# Patient Record
Sex: Female | Born: 1985 | Race: Black or African American | Hispanic: No | Marital: Single | Smoking: Current some day smoker
Health system: Southern US, Community
[De-identification: ages and names within clinical notes are randomized; demographics above are authoritative.]

## PROBLEM LIST (undated history)

## (undated) HISTORY — PX: KNEE SURGERY: SHX244

---

## 2017-08-25 ENCOUNTER — Ambulatory Visit: Payer: Self-pay | Admitting: Obstetrics & Gynecology

## 2017-08-25 ENCOUNTER — Encounter: Payer: Self-pay | Admitting: Obstetrics & Gynecology

## 2017-08-25 VITALS — BP 136/84 | HR 88 | Resp 16 | Ht 68.5 in | Wt 335.0 lb

## 2017-08-25 DIAGNOSIS — N938 Other specified abnormal uterine and vaginal bleeding: Secondary | ICD-10-CM

## 2017-08-25 MED ORDER — IBUPROFEN 800 MG PO TABS: 800.0000 mg | ORAL_TABLET | Freq: Three times a day (TID) | ORAL | 1 refills | Status: AC | PRN

## 2017-08-25 MED ORDER — MEDROXYPROGESTERONE ACETATE 10 MG PO TABS: ORAL_TABLET | ORAL | 12 refills | Status: AC

## 2017-08-25 NOTE — Progress Notes (Signed)
Patient ID: Kristin Larson, female   DOB: 1985/09/05, 32 y.o.   MRN: [id number]  Chief Complaint  Patient presents with  . Menstrual Problem    HPI Kristin Larson is a 32 y.o. female. Single P0 here today with the issue of irregular periods. Menarche at 70. Her periods were regular but super heavy until about 32 years old. She started OCPs and the periods then got regular. She stopped OCPs at about 32 yo. She has tried cyclic progestin. She will skip periods, almost an entire year. In the year 2018 she had about 3 periods.  She is actively trying to get pregnant. She has not used birth control since she was 21. She does not use condoms.   HPI  No past medical history on file.  Past Surgical History:  Procedure Laterality Date  . KNEE SURGERY Right     Family History  Problem Relation Age of Onset  . Breast cancer Maternal Grandmother     Social History Social History   Tobacco Use  . Smoking status: Current Some Day Smoker    Packs/day: 1.00    Years: 13.00    Pack years: 13.00    Types: Cigarettes  . Smokeless tobacco: Never Used  Substance Use Topics  . Alcohol use: Not Currently  . Drug use: Yes    Types: Marijuana    Allergies not on file  Current Outpatient Medications  Medication Sig Dispense Refill  . cetirizine (ZYRTEC) 10 MG tablet Take 10 mg by mouth daily.    Marland Kitchen ibuprofen (ADVIL,MOTRIN) 200 MG tablet Take by mouth.     No current facility-administered medications for this visit.     Review of Systems Review of Systems She reports a pap smear at [facility] in [place] in 2018. She reports that is was normal.  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 rate 16, height 5' 8.5" (1.74 m), weight (!) 335 lb (152 kg), last menstrual period 07/25/2017.  Physical Exam Physical Exam Breathing, conversing, and ambulating normally Morbidly obese  pleasant Black female  Data Reviewed No data available She reports TSH checked and normal at [facility] center Assessment    Oligomenorrhea, DUB, probable PCOS     Plan    Rec cyclic provera 10 day/month with 10 mg Rec weight loss Offered referral to RE I sent message to St[facility] Bang to get her into [facility] Gyn u/s       Kristin Larson 08/25/2017, 2:54 PM

## 2017-08-25 NOTE — Addendum Note (Signed)
Addended by: Allie Bossier on: 08/25/2017 03:21 PM   Modules accepted: Orders

## 2017-08-26 LAB — CBC
HEMATOCRIT: 36 % (ref 35.0–45.0)
HEMOGLOBIN: 12 g/dL (ref 11.7–15.5)
MCH: 30.6 pg (ref 27.0–33.0)
MCHC: 33.3 g/dL (ref 32.0–36.0)
MCV: 91.8 fL (ref 80.0–100.0)
MPV: 10.3 fL (ref 7.5–12.5)
Platelets: 347 10*3/uL (ref 140–400)
RBC: 3.92 10*6/uL (ref 3.80–5.10)
RDW: 13.4 % (ref 11.0–15.0)
WBC: 8.3 10*3/uL (ref 3.8–10.8)

## 2017-08-30 ENCOUNTER — Ambulatory Visit (INDEPENDENT_AMBULATORY_CARE_PROVIDER_SITE_OTHER): Payer: Self-pay

## 2017-08-30 DIAGNOSIS — N938 Other specified abnormal uterine and vaginal bleeding: Secondary | ICD-10-CM

## 2017-09-20 ENCOUNTER — Ambulatory Visit (INDEPENDENT_AMBULATORY_CARE_PROVIDER_SITE_OTHER): Payer: Self-pay | Admitting: Obstetrics & Gynecology

## 2017-09-20 ENCOUNTER — Encounter: Payer: Self-pay | Admitting: Obstetrics & Gynecology

## 2017-09-20 VITALS — BP 134/78 | HR 108 | Resp 16 | Ht 68.5 in | Wt 335.0 lb

## 2017-09-20 DIAGNOSIS — N915 Oligomenorrhea, unspecified: Secondary | ICD-10-CM

## 2017-09-20 NOTE — Progress Notes (Signed)
   Subjective:    Patient ID: Kristin Larson, female    DOB: 04/27/1985, 32 y.o.   MRN: [id number]  HPI 32 yo single G0 here for u/s results. She has oligomenorrhea. She has regular periods when on OCPs but rarely has periods without OCPs or cyclic progestin. Her u/s was normal (no PCOS appearance of ovaries) and a 3 mm   Review of Systems She has been contacted by St[facility] BangSabrina Holland for a [facility] pap smear.    Objective:   Physical Exam  Breathing, conversing, and ambulating normally Well nourished, well hydrated Black female, no apparent distress       Assessment & Plan:  Irregular presentation of PCOS- check LH/FSH/Prolactin I will message her through Mychart about these results

## 2017-09-21 LAB — FOLLICLE STIMULATING HORMONE: FSH: 4.4 m[IU]/mL

## 2017-09-21 LAB — LUTEINIZING HORMONE: LH: 4.8 m[IU]/mL

## 2017-09-21 LAB — PROLACTIN: Prolactin: 9.8 ng/mL

## 2019-08-10 IMAGING — US US TRANSVAGINAL NON-OB
1 series · 14 of 25 positions shown · non-contrast
Comparison: None.

CLINICAL DATA: Dysfunctional uterine bleeding

EXAM:
ULTRASOUND PELVIS TRANSVAGINAL
TECHNIQUE: Transvaginal ultrasound examination of the pelvis was performed
including evaluation of the uterus, ovaries, adnexal regions, and
pelvic cul-de-sac.

[Series 1: us transvaginal non-ob · 0.11mm/px · 14 of 61 slices shown]
[im 1/61]
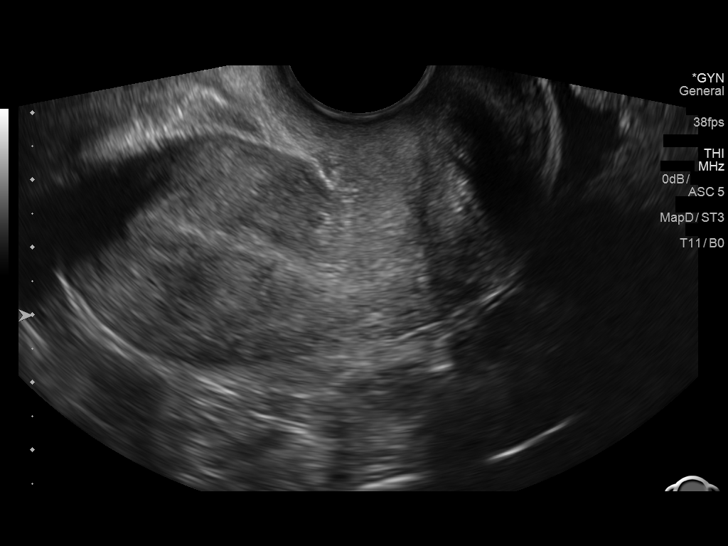
[im 6/61]
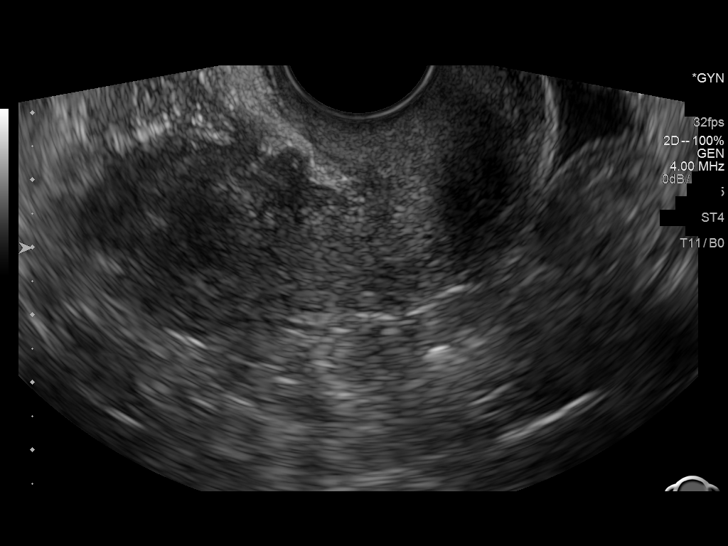
[im 11/61]
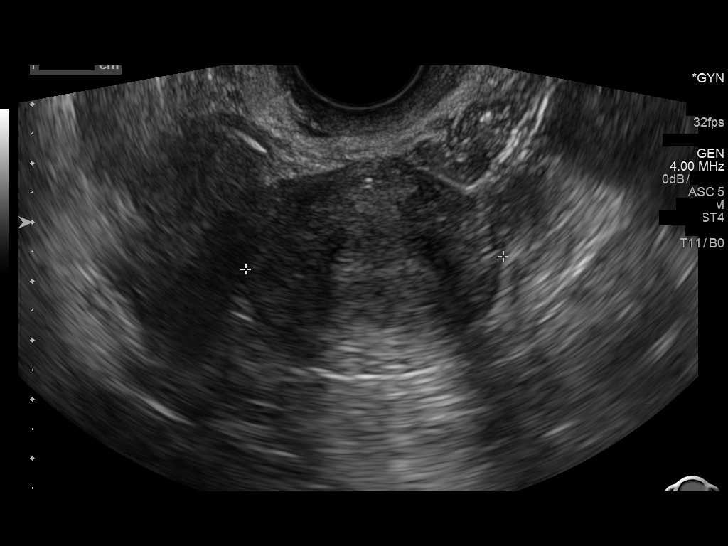
[im 16/61]
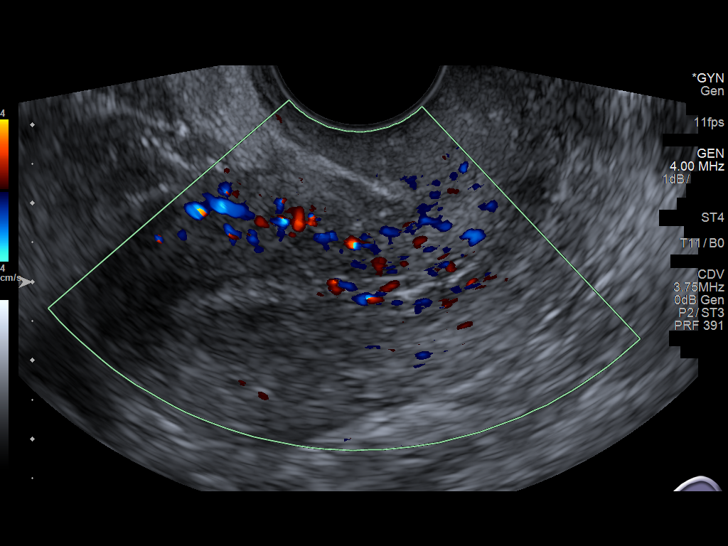
[im 21/61]
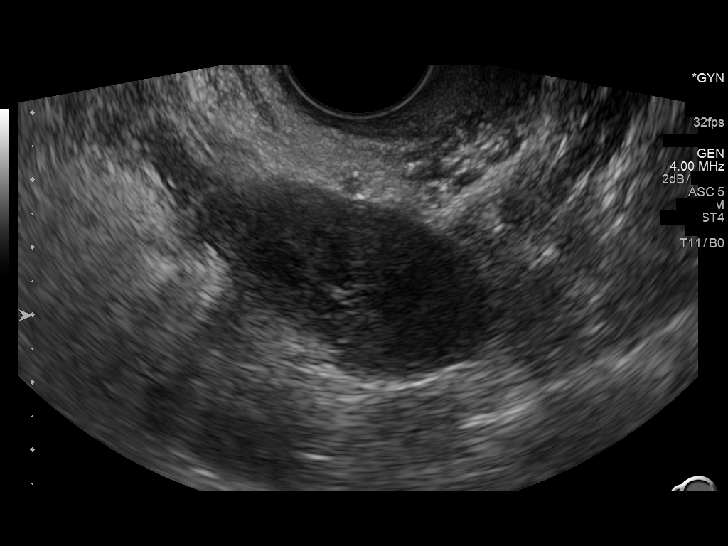
[im 23/61]
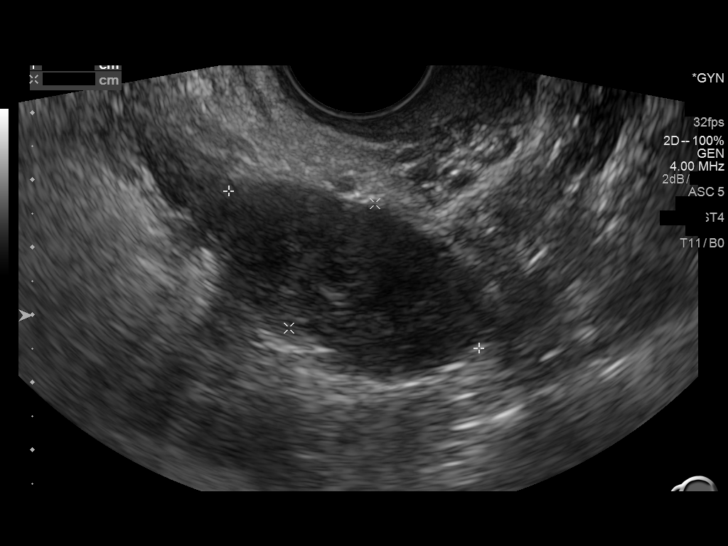
[im 28/61]
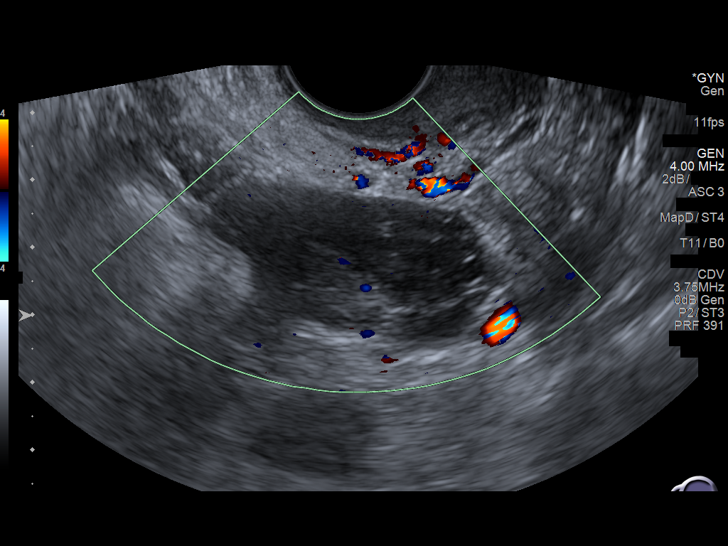
[im 33/61]
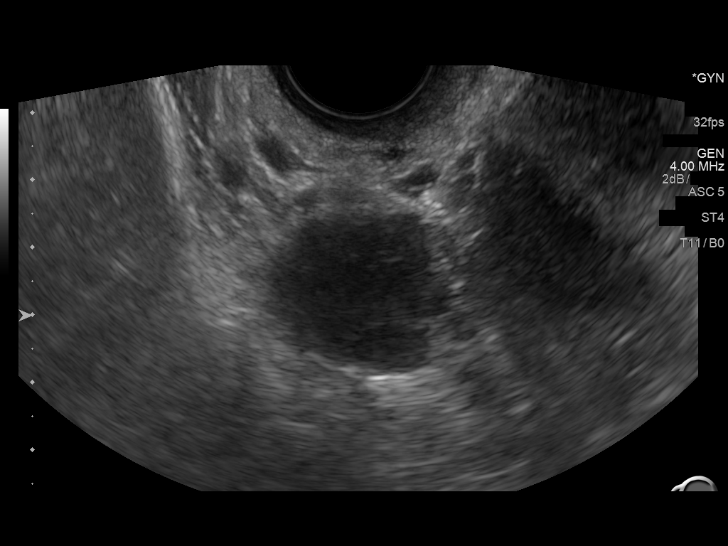
[im 38/61]
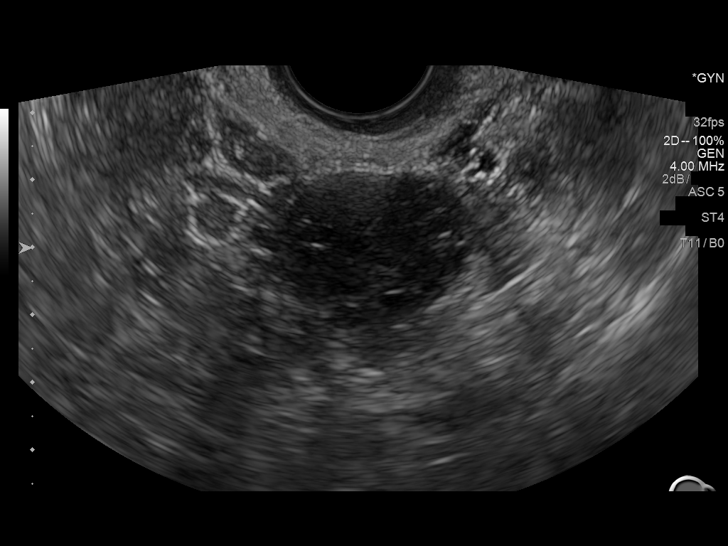
[im 41/61]
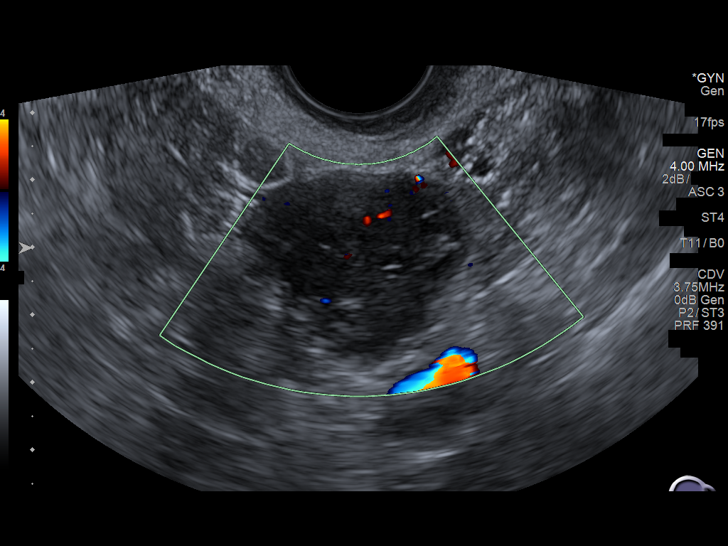
[im 46/61]
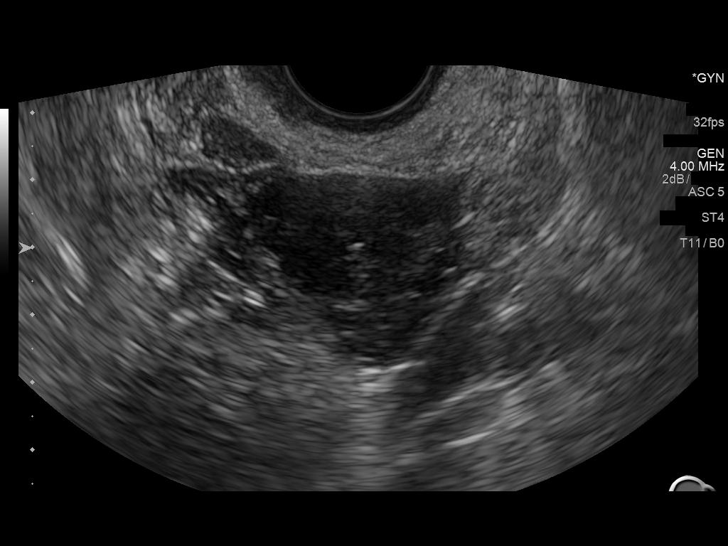
[im 51/61]
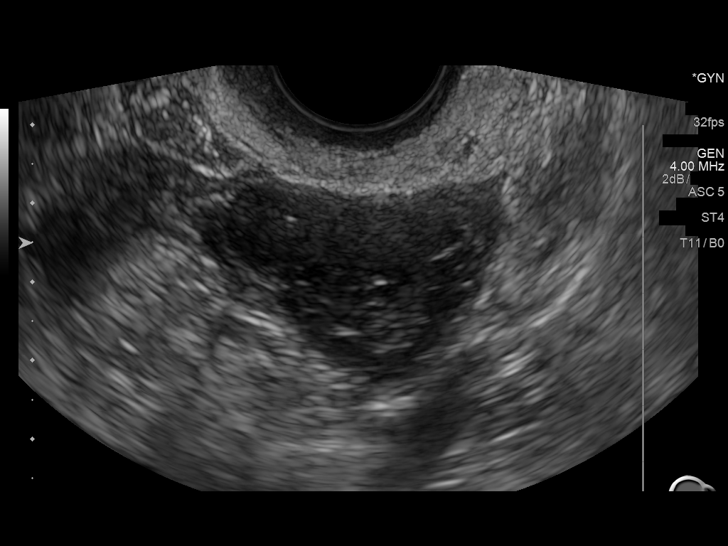
[im 56/61]
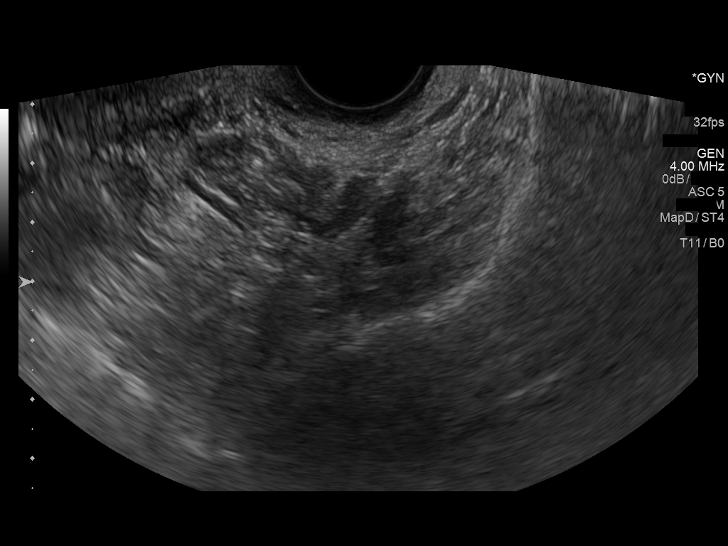
[im 61/61]
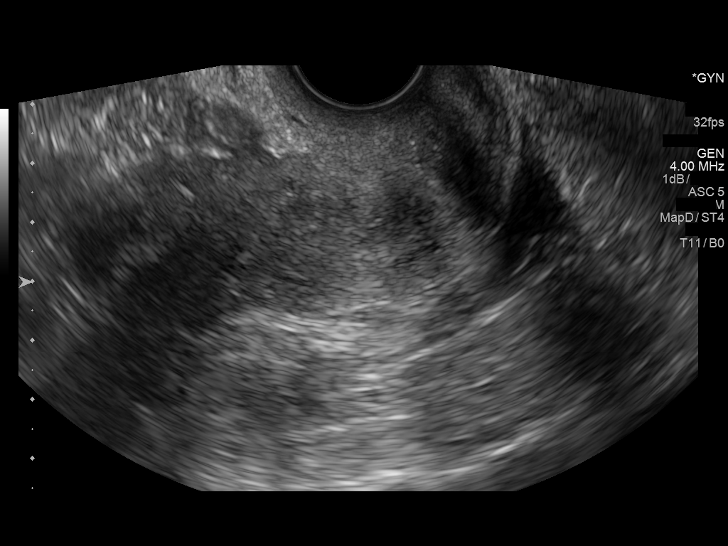

[14 of 25 positions shown; findings below may reference images not displayed]

FINDINGS: Uterus

Measurements: 7.0 x 3.4 x 4.4 cm. No fibroids or other mass
visualized.

Endometrium

Thickness: 3 mm in thickness.  No focal abnormality visualized.

Right ovary

Measurements: 4.4 x 2.2 x 2.5 cm. Normal appearance/no adnexal mass.

Left ovary

Measurements: 4.1 x 2.4 x 3.7 cm. Normal appearance/no adnexal mass.

Other findings:  Trace free fluid in the pelvis
IMPRESSION: Endometrium normal thickness without visible focal abnormality. If
bleeding remains unresponsive to hormonal or medical therapy,
sonohysterogram should be considered for focal lesion work-up. (Ref:
Radiological Reasoning: Algorithmic Workup of Abnormal Vaginal
Bleeding with Endovaginal Sonography and Sonohysterography. AJR
[id number]; 191:[id number])

## 2023-02-15 ENCOUNTER — Ambulatory Visit: Payer: Self-pay | Admitting: Allergy
